# Patient Record
Sex: Male | Born: 1990 | Race: White | Hispanic: No | Marital: Single | Smoking: Current some day smoker
Health system: Southern US, Community
[De-identification: ages and names within clinical notes are randomized; demographics above are authoritative.]

## PROBLEM LIST (undated history)

## (undated) HISTORY — PX: TONSILLECTOMY: SUR1361

---
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
## 2015-07-14 ENCOUNTER — Ambulatory Visit
Admission: EM | Admit: 2015-07-14 | Discharge: 2015-07-14 | Disposition: A | Discharge status: Home / Self Care | Payer: PRIVATE HEALTH INSURANCE | Attending: Family Medicine | Admitting: Family Medicine

## 2015-07-14 ENCOUNTER — Ambulatory Visit: Payer: PRIVATE HEALTH INSURANCE

## 2015-07-14 DIAGNOSIS — S9781XA Crushing injury of right foot, initial encounter: Secondary | ICD-10-CM

## 2015-07-14 MED ORDER — HYDROCODONE-ACETAMINOPHEN 5-325 MG PO TABS: 1.0000 | ORAL_TABLET | Freq: Four times a day (QID) | ORAL | Status: DC | PRN
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
## 2017-06-25 ENCOUNTER — Ambulatory Visit (INDEPENDENT_AMBULATORY_CARE_PROVIDER_SITE_OTHER): Payer: BLUE CROSS/BLUE SHIELD

## 2017-06-25 ENCOUNTER — Ambulatory Visit
Admission: EM | Admit: 2017-06-25 | Discharge: 2017-06-25 | Disposition: A | Discharge status: Home / Self Care | Payer: BLUE CROSS/BLUE SHIELD | Attending: Family Medicine | Admitting: Family Medicine

## 2017-06-25 DIAGNOSIS — M25511 Pain in right shoulder: Secondary | ICD-10-CM

## 2017-06-25 DIAGNOSIS — M7521 Bicipital tendinitis, right shoulder: Secondary | ICD-10-CM

## 2017-06-25 MED ORDER — MELOXICAM 15 MG PO TABS: 15.0000 mg | ORAL_TABLET | Freq: Every day | ORAL | 0 refills | Status: DC

## 2017-06-25 NOTE — ED Provider Notes (Signed)
[facility]    CSN: [id number] Arrival date & time: 06/25/17  1037     History   Chief Complaint Chief Complaint  Patient presents with  . Shoulder Injury    HPI Gregg Wallace is a 26 y.o. male.   Patient's here because of pain in the right shoulder. He reports last week Thursday [place] States he landed on his right elbow jamming his right elbow and shoulder. No loss of consciousness he states that the shoulder is progressively gotten worse causing more pain and discomfort last few days. He is unable to use his arm after full workday states arms or sore is keeping him up at night. He does smoke no surgeries other than tonsillectomy no chronic medical problems no known drug allergies and no pertinent family medical history relevant to today's visit.   The history is provided by the patient. No language interpreter was used.  Shoulder Injury  This is a new problem. The problem occurs constantly. The problem has been gradually worsening. Pertinent negatives include no chest pain, no abdominal pain, no headaches and no shortness of breath. Nothing aggravates the symptoms. Nothing relieves the symptoms. He has tried nothing for the symptoms. The treatment provided no relief.    History reviewed. No pertinent past medical history.  There are no active problems to display for this patient.   Past Surgical History:  Procedure Laterality Date  . TONSILLECTOMY         Home Medications    Prior to Admission medications   Medication Sig Start Date End Date Taking? Authorizing Provider  HYDROcodone-acetaminophen (NORCO/VICODIN) 5-325 MG tablet Take 1-2 tablets by mouth every 6 (six) hours as needed for severe pain. 07/14/15   Lutricia Feil, PA-C  meloxicam (MOBIC) 15 MG tablet Take 1 tablet (15 mg total) by mouth daily. 06/25/17   Hassan Rowan, MD    Family History Family History  Problem Relation Age of Onset  . Heart  attack Father     Social History Social History  Substance Use Topics  . Smoking status: Current Every Day Smoker    Packs/day: 0.50    Years: 4.00    Types: Cigarettes  . Smokeless tobacco: Never Used  . Alcohol use 0.0 oz/week     Comment: rarely     Allergies   Patient has no known allergies.   Review of Systems Review of Systems  Respiratory: Negative for shortness of breath.   Cardiovascular: Negative for chest pain.  Gastrointestinal: Negative for abdominal pain.  Musculoskeletal: Positive for myalgias.  Skin: Negative for color change, rash and wound.  Neurological: Negative for headaches.  All other systems reviewed and are negative.    Physical Exam Triage Vital Signs ED Triage Vitals  Enc Vitals Group     BP 06/25/17 1121 127/80     Pulse Rate 06/25/17 1121 72     Resp 06/25/17 1121 16     Temp 06/25/17 1121 98.2 F (36.8 C)     Temp Source 06/25/17 1121 Oral     SpO2 06/25/17 1121 100 %     Weight 06/25/17 1122 200 lb (90.7 kg)     Height 06/25/17 1122  (1.702 m)     Head Circumference --      Peak Flow --      Pain Score 06/25/17 1123 5     Pain Loc --      Pain Edu? --  Excl. in GC? --    No data found.   Updated Vital Signs BP 127/80 (BP Location: Left Arm)   Pulse 72   Temp 98.2 F (36.8 C) (Oral)   Resp 16   Ht  (1.702 m)   Wt 200 lb (90.7 kg)   SpO2 100%   BMI 31.32 kg/m   Visual Acuity Right Eye Distance:   Left Eye Distance:   Bilateral Distance:    Right Eye Near:   Left Eye Near:    Bilateral Near:     Physical Exam  Constitutional: He is oriented to person, place, and time. He appears well-developed and well-nourished.  HENT:  Head: Normocephalic and atraumatic.  Eyes: Pupils are equal, round, and reactive to light.  Neck: Normal range of motion. Neck supple. No thyromegaly present.  Pulmonary/Chest: Effort normal.  Musculoskeletal: He exhibits tenderness.  Neurological: He is alert and oriented to  person, place, and time. No cranial nerve deficit. Coordination normal.  Skin: There is erythema.  Psychiatric: He has a normal mood and affect.  Vitals reviewed.    UC Treatments / Results  Labs (all labs ordered are listed, but only abnormal results are displayed) Labs Reviewed - No data to display  EKG  EKG Interpretation None       Radiology No results found.  Procedures Procedures (including critical care time)  Medications Ordered in UC Medications - No data to display   Initial Impression / Assessment and Plan / UC Course  I have reviewed the triage vital signs and the nursing notes.  Pertinent labs & imaging results that were available during my care of the patient were reviewed by me and considered in my medical decision making (see chart for details).       Final Clinical Impressions(s) / UC Diagnoses   Final diagnoses:  Pain in joint of right shoulder  Biceps tendonitis on right    New Prescriptions New Prescriptions   MELOXICAM (MOBIC) 15 MG TABLET    Take 1 tablet (15 mg total) by mouth daily.     Controlled Substance Prescriptions [place] Controlled Substance Registry consulted? Not Applicable   Hassan Rowan, MD 06/27/17 1919

## 2017-06-25 NOTE — ED Triage Notes (Signed)
Pt reports 8 days ago fell down some bleachers and a few days later his shoulder started hurting 5/10. Has full ROM but "hard to make the movement like I'm gonna punch."
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
## 2023-02-03 ENCOUNTER — Ambulatory Visit
Admission: EM | Admit: 2023-02-03 | Discharge: 2023-02-03 | Disposition: A | Discharge status: Home / Self Care | Payer: BC Managed Care – PPO | Attending: Emergency Medicine | Admitting: Emergency Medicine

## 2023-02-03 DIAGNOSIS — J0140 Acute pansinusitis, unspecified: Secondary | ICD-10-CM | POA: Diagnosis not present

## 2023-02-03 MED ORDER — AMOXICILLIN-POT CLAVULANATE 875-125 MG PO TABS: 1.0000 | ORAL_TABLET | Freq: Two times a day (BID) | ORAL | 0 refills | Status: AC

## 2023-02-03 MED ORDER — IPRATROPIUM BROMIDE 0.06 % NA SOLN: 2.0000 | Freq: Four times a day (QID) | NASAL | 12 refills | Status: DC

## 2023-02-03 NOTE — ED Provider Notes (Signed)
[facility]    CSN: [id number] Arrival date & time: 02/03/23  1026      History   Chief Complaint Chief Complaint  Patient presents with   Headache   Dizziness    HPI Torivio Virola is a 32 y.o. male.   HPI  33 year old male with no significant past medical history presents for evaluation of 3 days worth of right frontal headache.  He does endorse that he felt a little off balance this morning.  There is no associated changes in vision or vomiting.  Patient also denies ear pain, or fever.  He reports that he has pain behind his right eye and the also has mild nausea with nasal congestion, green nasal discharge, and a nonproductive cough.  He denies aura.  History reviewed. No pertinent past medical history.  There are no problems to display for this patient.   Past Surgical History:  Procedure Laterality Date   TONSILLECTOMY         Home Medications    Prior to Admission medications   Medication Sig Start Date End Date Taking? Authorizing Provider  amoxicillin-clavulanate (AUGMENTIN) 875-125 MG tablet Take 1 tablet by mouth every 12 (twelve) hours for 10 days. 02/03/23 02/13/23 Yes Becky Augusta, NP  ipratropium (ATROVENT) 0.06 % nasal spray Place 2 sprays into both nostrils 4 (four) times daily. 02/03/23  Yes Becky Augusta, NP    Family History Family History  Problem Relation Age of Onset   Heart attack Father     Social History Social History   Tobacco Use   Smoking status: Every Day    Packs/day: 0.50    Years: 4.00    Additional pack years: 0.00    Total pack years: 2.00    Types: Cigarettes   Smokeless tobacco: Never  Substance Use Topics   Alcohol use: Yes    Alcohol/week: 0.0 standard drinks of alcohol    Comment: rarely   Drug use: No     Allergies   Patient has no known allergies.   Review of Systems Review of Systems  Constitutional:  Negative for fever.  HENT:  Positive for congestion, rhinorrhea, sinus pressure and  sinus pain. Negative for ear pain and sore throat.   Eyes:  Negative for visual disturbance.  Respiratory:  Positive for cough.   Neurological:  Positive for light-headedness and headaches. Negative for syncope, weakness and numbness.     Physical Exam Triage Vital Signs ED Triage Vitals [02/03/23 1054]  Enc Vitals Group     BP      Pulse      Resp 16     Temp      Temp Source Oral     SpO2      Weight      Height      Head Circumference      Peak Flow      Pain Score      Pain Loc      Pain Edu?      Excl. in GC?    No data found.  Updated Vital Signs BP 122/79 (BP Location: Right Arm)   Pulse 72   Temp 98.5 F (36.9 C) (Oral)   Resp 16   Ht 5\' 6"  (1.676 m)   Wt 210 lb (95.3 kg)   SpO2 98%   BMI 33.89 kg/m   Visual Acuity Right Eye Distance:   Left Eye Distance:   Bilateral Distance:    Right Eye Near:  Left Eye Near:    Bilateral Near:     Physical Exam Vitals and nursing note reviewed.  Constitutional:      Appearance: Normal appearance. He is not ill-appearing.  HENT:     Head: Normocephalic.     Right Ear: Tympanic membrane, ear canal and external ear normal. There is no impacted cerumen.     Left Ear: Tympanic membrane, ear canal and external ear normal. There is no impacted cerumen.     Nose: Congestion and rhinorrhea present.     Comments: Nasal mucosa is erythematous and edematous with thick milky discharge in the right nare.  Patient has tenderness to compression of right frontal and maxillary sinuses.  The left are benign.    Mouth/Throat:     Mouth: Mucous membranes are moist.     Pharynx: Oropharyngeal exudate and posterior oropharyngeal erythema present.     Comments: 0 oropharynx is erythematous and injected with milky white postnasal drip. Cardiovascular:     Rate and Rhythm: Normal rate and regular rhythm.     Pulses: Normal pulses.     Heart sounds: Normal heart sounds. No murmur heard.    No friction rub. No gallop.  Pulmonary:      Effort: Pulmonary effort is normal.     Breath sounds: Normal breath sounds. No wheezing, rhonchi or rales.  Musculoskeletal:     Cervical back: Normal range of motion and neck supple.  Skin:    General: Skin is warm and dry.     Capillary Refill: Capillary refill takes less than 2 seconds.     Findings: No erythema or rash.  Neurological:     General: No focal deficit present.     Mental Status: He is alert and oriented to person, place, and time.      UC Treatments / Results  Labs (all labs ordered are listed, but only abnormal results are displayed) Labs Reviewed - No data to display  EKG   Radiology No results found.  Procedures Procedures (including critical care time)  Medications Ordered in UC Medications - No data to display  Initial Impression / Assessment and Plan / UC Course  I have reviewed the triage vital signs and the nursing notes.  Pertinent labs & imaging results that were available during my care of the patient were reviewed by me and considered in my medical decision making (see chart for details).   Patient is a pleasant, nontoxic-appearing 32 year old male presenting for evaluation of 30s with a headache with onset of dizziness this morning.  Under further questioning patient does have nasal congestion with sinus pressure and purulent nasal discharge.  On exam his nasal mucosa is erythematous and edematous with milky white discharge in both nares and he has got tenderness to right frontal and maxillary sinuses.  I suspect that the patient has pansinusitis and this is what is causing his headache.  I will treat him for sinusitis with Augmentin 875 twice daily with food for 10 days.  Also Atrovent nasal spray to help with the nasal congestion.  We discussed performing sinus irrigation to help alleviate the mucus burden as well as using plain over-the-counter Mucinex to help break up the stickiness of the mucus so is easier for his body to clear.  Return  precautions reviewed.  Work note provided.   Final Clinical Impressions(s) / UC Diagnoses   Final diagnoses:  Acute non-recurrent pansinusitis     Discharge Instructions      The Augmentin twice daily  with food for 10 days for treatment of your sinusitis.  Perform sinus irrigation 2-3 times a day with a NeilMed sinus rinse kit and distilled water.  Do not use tap water.  You can use plain over-the-counter Mucinex every 6 hours to break up the stickiness of the mucus so your body can clear it.  Increase your oral fluid intake to thin out your mucus so that is also able for your body to clear more easily.  Take an over-the-counter probiotic, such as Culturelle-align-activia, 1 hour after each dose of antibiotic to prevent diarrhea.  Use the Atrovent nasal spray to help with congestion. Instill 2 squirts in each nostril every 6 hours as needed.  If you develop any new or worsening symptoms return for reevaluation or see your primary care provider.      ED Prescriptions     Medication Sig Dispense Auth. Provider   amoxicillin-clavulanate (AUGMENTIN) 875-125 MG tablet Take 1 tablet by mouth every 12 (twelve) hours for 10 days. 20 tablet Becky Augusta, NP   ipratropium (ATROVENT) 0.06 % nasal spray Place 2 sprays into both nostrils 4 (four) times daily. 15 mL Becky Augusta, NP      PDMP not reviewed this encounter.   Becky Augusta, NP 02/03/23 1134

## 2023-02-03 NOTE — ED Triage Notes (Signed)
Pt c/o HA x3 days & dizziness since this AM. Denies any hx of migraines, has tried ibuprofen & tylenol w/o relief. Denies any blurred vision.

## 2023-02-03 NOTE — Discharge Instructions (Signed)
The Augmentin twice daily with food for 10 days for treatment of your sinusitis.  Perform sinus irrigation 2-3 times a day with a NeilMed sinus rinse kit and distilled water.  Do not use tap water.  You can use plain over-the-counter Mucinex every 6 hours to break up the stickiness of the mucus so your body can clear it.  Increase your oral fluid intake to thin out your mucus so that is also able for your body to clear more easily.  Take an over-the-counter probiotic, such as Culturelle-align-activia, 1 hour after each dose of antibiotic to prevent diarrhea.  Use the Atrovent nasal spray to help with congestion. Instill 2 squirts in each nostril every 6 hours as needed.  If you develop any new or worsening symptoms return for reevaluation or see your primary care provider.

## 2023-10-05 ENCOUNTER — Ambulatory Visit
Admission: EM | Admit: 2023-10-05 | Discharge: 2023-10-05 | Disposition: A | Discharge status: Home / Self Care | Payer: BC Managed Care – PPO | Attending: Emergency Medicine | Admitting: Emergency Medicine

## 2023-10-05 ENCOUNTER — Encounter: Payer: Self-pay | Admitting: Emergency Medicine

## 2023-10-05 DIAGNOSIS — L0100 Impetigo, unspecified: Secondary | ICD-10-CM | POA: Diagnosis not present

## 2023-10-05 MED ORDER — PREDNISONE 10 MG (21) PO TBPK: ORAL_TABLET | ORAL | 0 refills | Status: AC

## 2023-10-05 MED ORDER — AMOXICILLIN-POT CLAVULANATE 875-125 MG PO TABS: 1.0000 | ORAL_TABLET | Freq: Two times a day (BID) | ORAL | 0 refills | Status: AC

## 2023-10-05 NOTE — ED Provider Notes (Signed)
 [facility]    CSN: [id number] Arrival date & time: 10/05/23  9191      History   Chief Complaint Chief Complaint  Patient presents with   chapped lips     HPI Gregg Wallace is a 33 y.o. male.   HPI  33 year old male with no significant past medical history presents for evaluation of irritation to his upper lip that started 2 days ago.  He reports that he was using Burts bees Chapstick prior to the irritation developing.  Per his report, he states that his wife remembers something similar happening when he used the same Chapstick in the past.  He denies any pain, itching, or drainage.  No history of allergies.  No history of cold sores.  History reviewed. No pertinent past medical history.  There are no active problems to display for this patient.   Past Surgical History:  Procedure Laterality Date   TONSILLECTOMY         Home Medications    Prior to Admission medications   Medication Sig Start Date End Date Taking? Authorizing Provider  amoxicillin -clavulanate (AUGMENTIN ) 875-125 MG tablet Take 1 tablet by mouth every 12 (twelve) hours for 7 days. 10/05/23 10/12/23 Yes Bernardino Ditch, NP  predniSONE  (STERAPRED UNI-PAK 21 TAB) 10 MG (21) TBPK tablet Take 6 tablets on day 1, 5 tablets day 2, 4 tablets day 3, 3 tablets day 4, 2 tablets day 5, 1 tablet day 6 10/05/23  Yes Bernardino Ditch, NP    Family History Family History  Problem Relation Age of Onset   Heart attack Father     Social History Social History   Tobacco Use   Smoking status: Some Days    Current packs/day: 0.50    Average packs/day: 0.5 packs/day for 4.0 years (2.0 ttl pk-yrs)    Types: Cigarettes   Smokeless tobacco: Never  Vaping Use   Vaping status: Never Used  Substance Use Topics   Alcohol use: Yes    Alcohol/week: 0.0 standard drinks of alcohol    Comment: rarely   Drug use: No     Allergies   Patient has no known allergies.   Review of Systems Review of Systems   Constitutional:  Negative for fever.  Skin:  Positive for color change and rash.       Skin lesion on upper lip     Physical Exam Triage Vital Signs ED Triage Vitals [10/05/23 0815]  Encounter Vitals Group     BP      Systolic BP Percentile      Diastolic BP Percentile      Pulse      Resp      Temp      Temp src      SpO2      Weight      Height      Head Circumference      Peak Flow      Pain Score 0     Pain Loc      Pain Education      Exclude from Growth Chart    No data found.  Updated Vital Signs BP 128/80 (BP Location: Right Arm)   Pulse 74   Temp 98.1 F (36.7 C) (Oral)   Resp 16   SpO2 97%   Visual Acuity Right Eye Distance:   Left Eye Distance:   Bilateral Distance:    Right Eye Near:   Left Eye Near:    Bilateral Near:  Physical Exam Vitals and nursing note reviewed.  Constitutional:      Appearance: Normal appearance. He is not ill-appearing.  HENT:     Head: Normocephalic and atraumatic.     Mouth/Throat:     Mouth: Mucous membranes are moist.     Pharynx: Oropharyngeal exudate and posterior oropharyngeal erythema present.     Comments: Erythematous lesion on the central left upper lip with honey crust. Skin:    General: Skin is warm and dry.     Capillary Refill: Capillary refill takes less than 2 seconds.  Neurological:     General: No focal deficit present.     Mental Status: He is alert and oriented to person, place, and time.      UC Treatments / Results  Labs (all labs ordered are listed, but only abnormal results are displayed) Labs Reviewed - No data to display  EKG   Radiology No results found.  Procedures Procedures (including critical care time)  Medications Ordered in UC Medications - No data to display  Initial Impression / Assessment and Plan / UC Course  I have reviewed the triage vital signs and the nursing notes.  Pertinent labs & imaging results that were available during my care of the patient  were reviewed by me and considered in my medical decision making (see chart for details).   Patient is a pleasant, nontoxic-appearing 33 year old male presenting for evaluation of a lesion on his left upper lip as outlined HPI above.  As you can see the image above, the lesion has an erythematous base with a honey crust.  The presentation is more indicative of impetigo though there is no pain or itching.  Is also possible that it could be an allergic reaction.  The patient has no history of cold sores and is lesion does not appear herpetic.  He also reports that it was a brand-new to the Chapstick and he was not using anyone else's.  I am going to place the patient on Augmentin  875 mg twice daily for 7 days to cover for potential impetigo as well as a prednisone  taper in the event that this is an atypical allergic response.  I have encouraged him to no longer use Burts bees.  I will have him use Aquaphor or petroleum jelly to help protect and moisturize his lips.  Final Clinical Impressions(s) / UC Diagnoses   Final diagnoses:  Impetigo     Discharge Instructions      As we discussed, the lesion on your upper lip resembles impetigo, which is a staph or strep infection.  This may also be an atypical presentation of an allergic response to the Burts bees Chapstick that you are using.  I would encourage you to stop using Burts bees and only use Aquaphor or petroleum jelly to protect and moisturize your lips.  Take the Augmentin  875 mg twice daily with food for 7 days for treatment of potential impetigo.  Start the prednisone  taper this morning when you pick it up.  Going forward you will take it at breakfast time.  You will decrease your dosing over the next 6 days.  This will help to decrease in the immune response if this is an allergic reaction.  If you develop any increased swelling, pain, drainage from the lip, swelling of your lips or tongue, or other concerning symptoms please return for  reevaluation, see your primary care provider, or seek care in the ER.     ED Prescriptions  Medication Sig Dispense Auth. Provider   amoxicillin -clavulanate (AUGMENTIN ) 875-125 MG tablet Take 1 tablet by mouth every 12 (twelve) hours for 7 days. 14 tablet Bernardino Ditch, NP   predniSONE  (STERAPRED UNI-PAK 21 TAB) 10 MG (21) TBPK tablet Take 6 tablets on day 1, 5 tablets day 2, 4 tablets day 3, 3 tablets day 4, 2 tablets day 5, 1 tablet day 6 21 tablet Bernardino Ditch, NP      PDMP not reviewed this encounter.   Bernardino Ditch, NP 10/05/23 [phone]

## 2023-10-05 NOTE — Discharge Instructions (Signed)
 As we discussed, the lesion on your upper lip resembles impetigo, which is a staph or strep infection.  This may also be an atypical presentation of an allergic response to the Burts bees Chapstick that you are using.  I would encourage you to stop using Burts bees and only use Aquaphor or petroleum jelly to protect and moisturize your lips.  Take the Augmentin  875 mg twice daily with food for 7 days for treatment of potential impetigo.  Start the prednisone  taper this morning when you pick it up.  Going forward you will take it at breakfast time.  You will decrease your dosing over the next 6 days.  This will help to decrease in the immune response if this is an allergic reaction.  If you develop any increased swelling, pain, drainage from the lip, swelling of your lips or tongue, or other concerning symptoms please return for reevaluation, see your primary care provider, or seek care in the ER.

## 2023-10-05 NOTE — ED Triage Notes (Signed)
 Pt presents with dry irritated top lip x 2 days.
